# Patient Record
Sex: Male | Born: 1980 | Race: White | Hispanic: No | Marital: Single | Smoking: Current every day smoker
Health system: Southern US, Community
[De-identification: ages and names within clinical notes are randomized; demographics above are authoritative.]

---

## 2010-10-26 ENCOUNTER — Emergency Department (HOSPITAL_BASED_OUTPATIENT_CLINIC_OR_DEPARTMENT_OTHER)
Admission: EM | Admit: 2010-10-26 | Discharge: 2010-10-27 | Disposition: A | Discharge status: Home / Self Care | Payer: Self-pay | Attending: Emergency Medicine | Admitting: Emergency Medicine

## 2010-10-26 ENCOUNTER — Emergency Department (HOSPITAL_BASED_OUTPATIENT_CLINIC_OR_DEPARTMENT_OTHER): Payer: Self-pay

## 2010-10-26 ENCOUNTER — Emergency Department (INDEPENDENT_AMBULATORY_CARE_PROVIDER_SITE_OTHER): Payer: Self-pay

## 2010-10-26 DIAGNOSIS — K859 Acute pancreatitis without necrosis or infection, unspecified: Secondary | ICD-10-CM | POA: Insufficient documentation

## 2010-10-26 DIAGNOSIS — R1013 Epigastric pain: Secondary | ICD-10-CM

## 2010-10-26 LAB — CBC
HCT: 44.2 % (ref 39.0–52.0)
Hemoglobin: 16.2 g/dL (ref 13.0–17.0)
MCV: 86.3 fL (ref 78.0–100.0)
RDW: 12.5 % (ref 11.5–15.5)
WBC: 10.8 10*3/uL — ABNORMAL HIGH (ref 4.0–10.5)

## 2010-10-26 LAB — URINALYSIS, ROUTINE W REFLEX MICROSCOPIC
Hgb urine dipstick: NEGATIVE
Nitrite: NEGATIVE
Protein, ur: NEGATIVE mg/dL
Specific Gravity, Urine: 1.022 (ref 1.005–1.030)
Urobilinogen, UA: 0.2 mg/dL (ref 0.0–1.0)

## 2010-10-26 LAB — DIFFERENTIAL
Basophils Absolute: 0 10*3/uL (ref 0.0–0.1)
Basophils Relative: 0 % (ref 0–1)
Eosinophils Absolute: 0.4 10*3/uL (ref 0.0–0.7)
Neutro Abs: 8 10*3/uL — ABNORMAL HIGH (ref 1.7–7.7)
Neutrophils Relative %: 74 % (ref 43–77)

## 2010-10-26 LAB — LIPASE, BLOOD: Lipase: 790 U/L — ABNORMAL HIGH (ref 11–59)

## 2010-10-26 LAB — COMPREHENSIVE METABOLIC PANEL
ALT: 15 U/L (ref 0–53)
Alkaline Phosphatase: 68 U/L (ref 39–117)
BUN: 11 mg/dL (ref 6–23)
CO2: 27 mEq/L (ref 19–32)
GFR calc non Af Amer: >60 mL/min (ref >60)
Glucose, Bld: 103 mg/dL — ABNORMAL HIGH (ref 70–99)
Potassium: 3.7 mEq/L (ref 3.5–5.1)
Sodium: 139 mEq/L (ref 135–145)
Total Bilirubin: 0.7 mg/dL (ref 0.3–1.2)

## 2010-10-26 MED ORDER — IOHEXOL 300 MG/ML  SOLN
100.0000 mL | Freq: Once | INTRAMUSCULAR | Status: AC | PRN
  Administered 2010-10-26: 100 mL via INTRAVENOUS

## 2010-10-27 ENCOUNTER — Telehealth: Payer: Self-pay | Admitting: Internal Medicine

## 2010-10-27 NOTE — Telephone Encounter (Signed)
Pt was seen at [facility] last night for pancreatitis. Offered pt an appt with Dr. Marina Goodell for May 14th and informed pt that there would be a copay of $184.00. Pt states that he cannot afford the copay and cancelled the appt. Pt had questions about diet with pancreatitis and I reviewed what he should stay away from . Informed pt that [facility] was on unassigned call for [facility] and that he may want to check with them. Pt verbalized understanding.

## 2011-10-23 IMAGING — CR DG CHEST 2V
2 series · 2 of 2 positions shown · non-contrast
Comparison: None.

CLINICAL DATA: Epigastric abdominal pain

CHEST - 2 VIEW

[w chest pa]
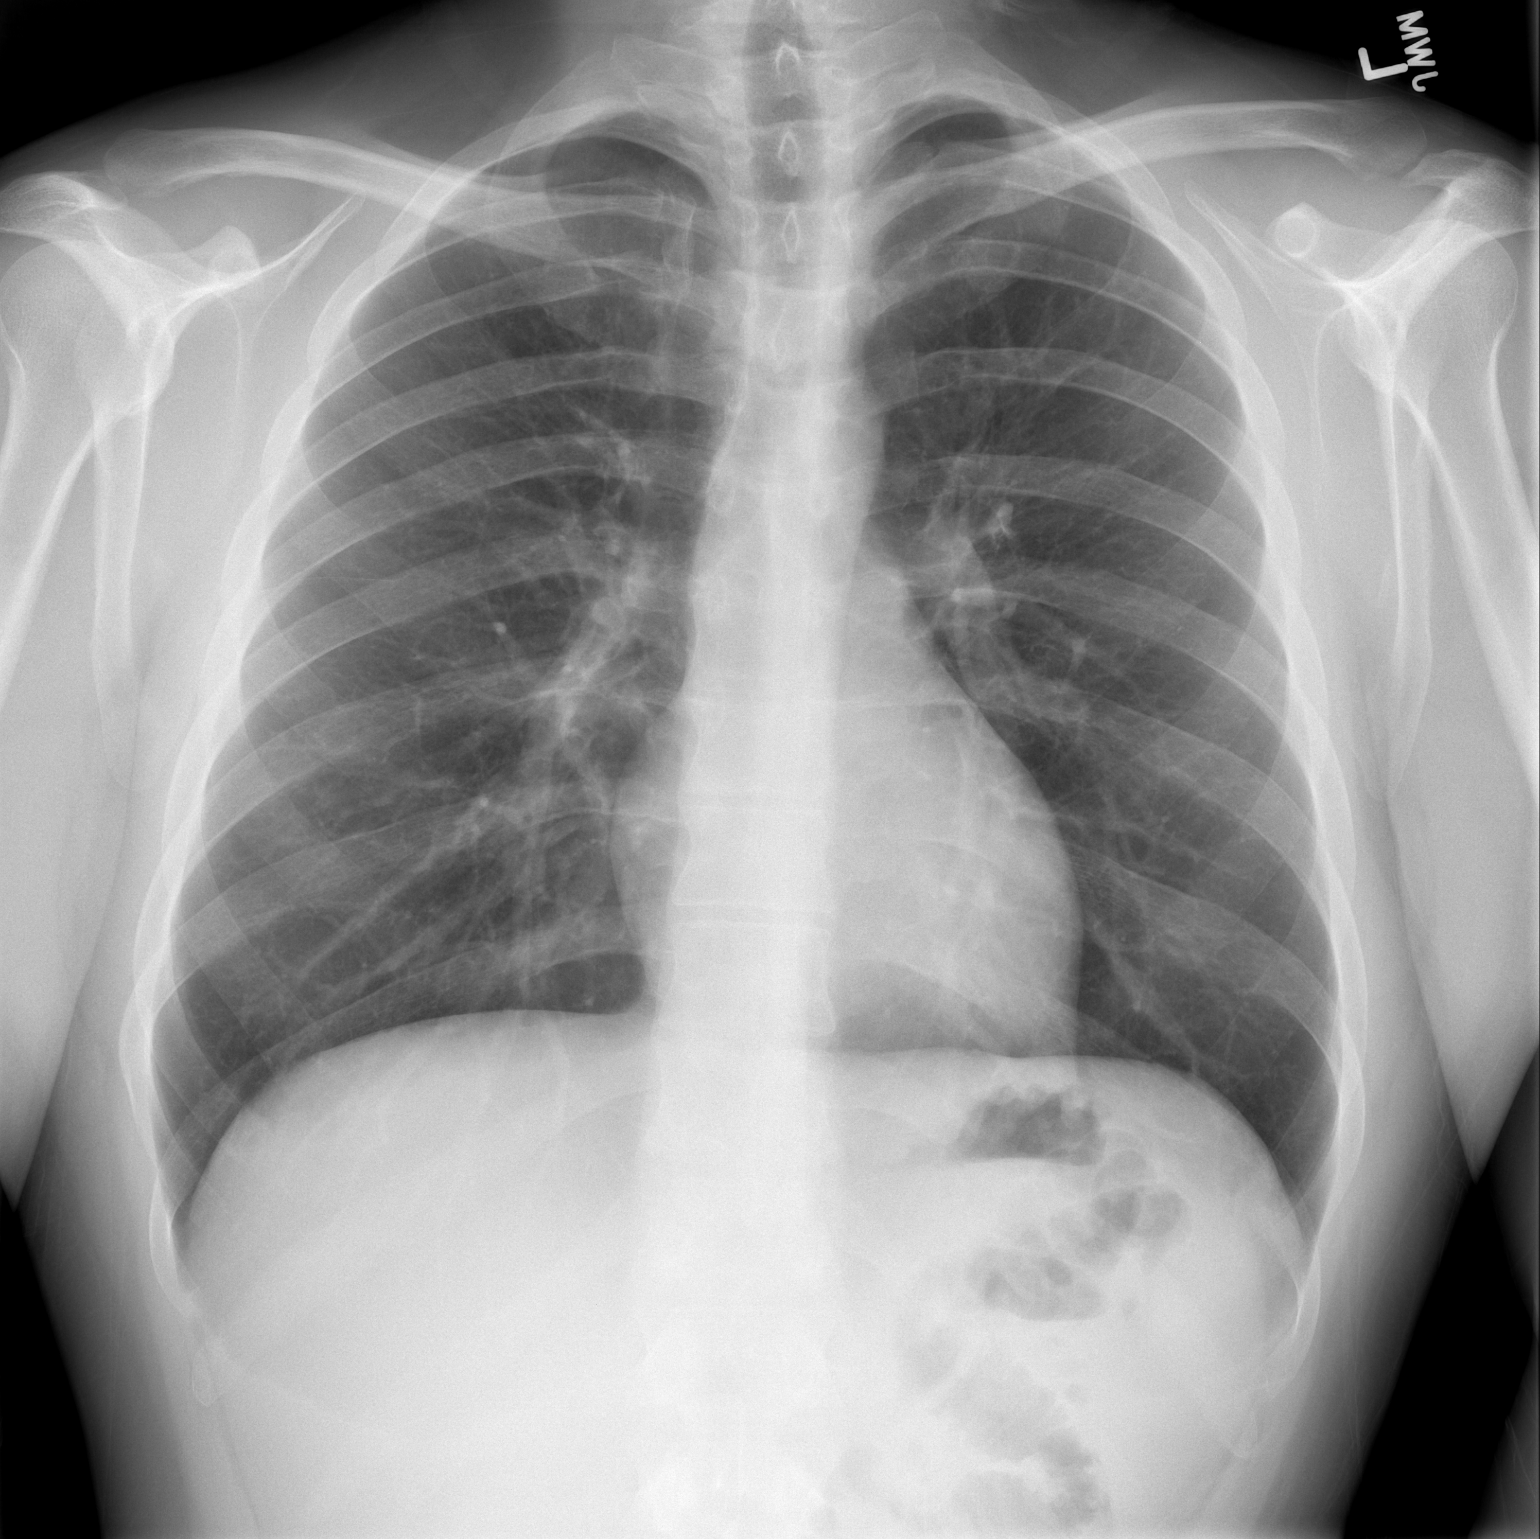

[w chest lat]
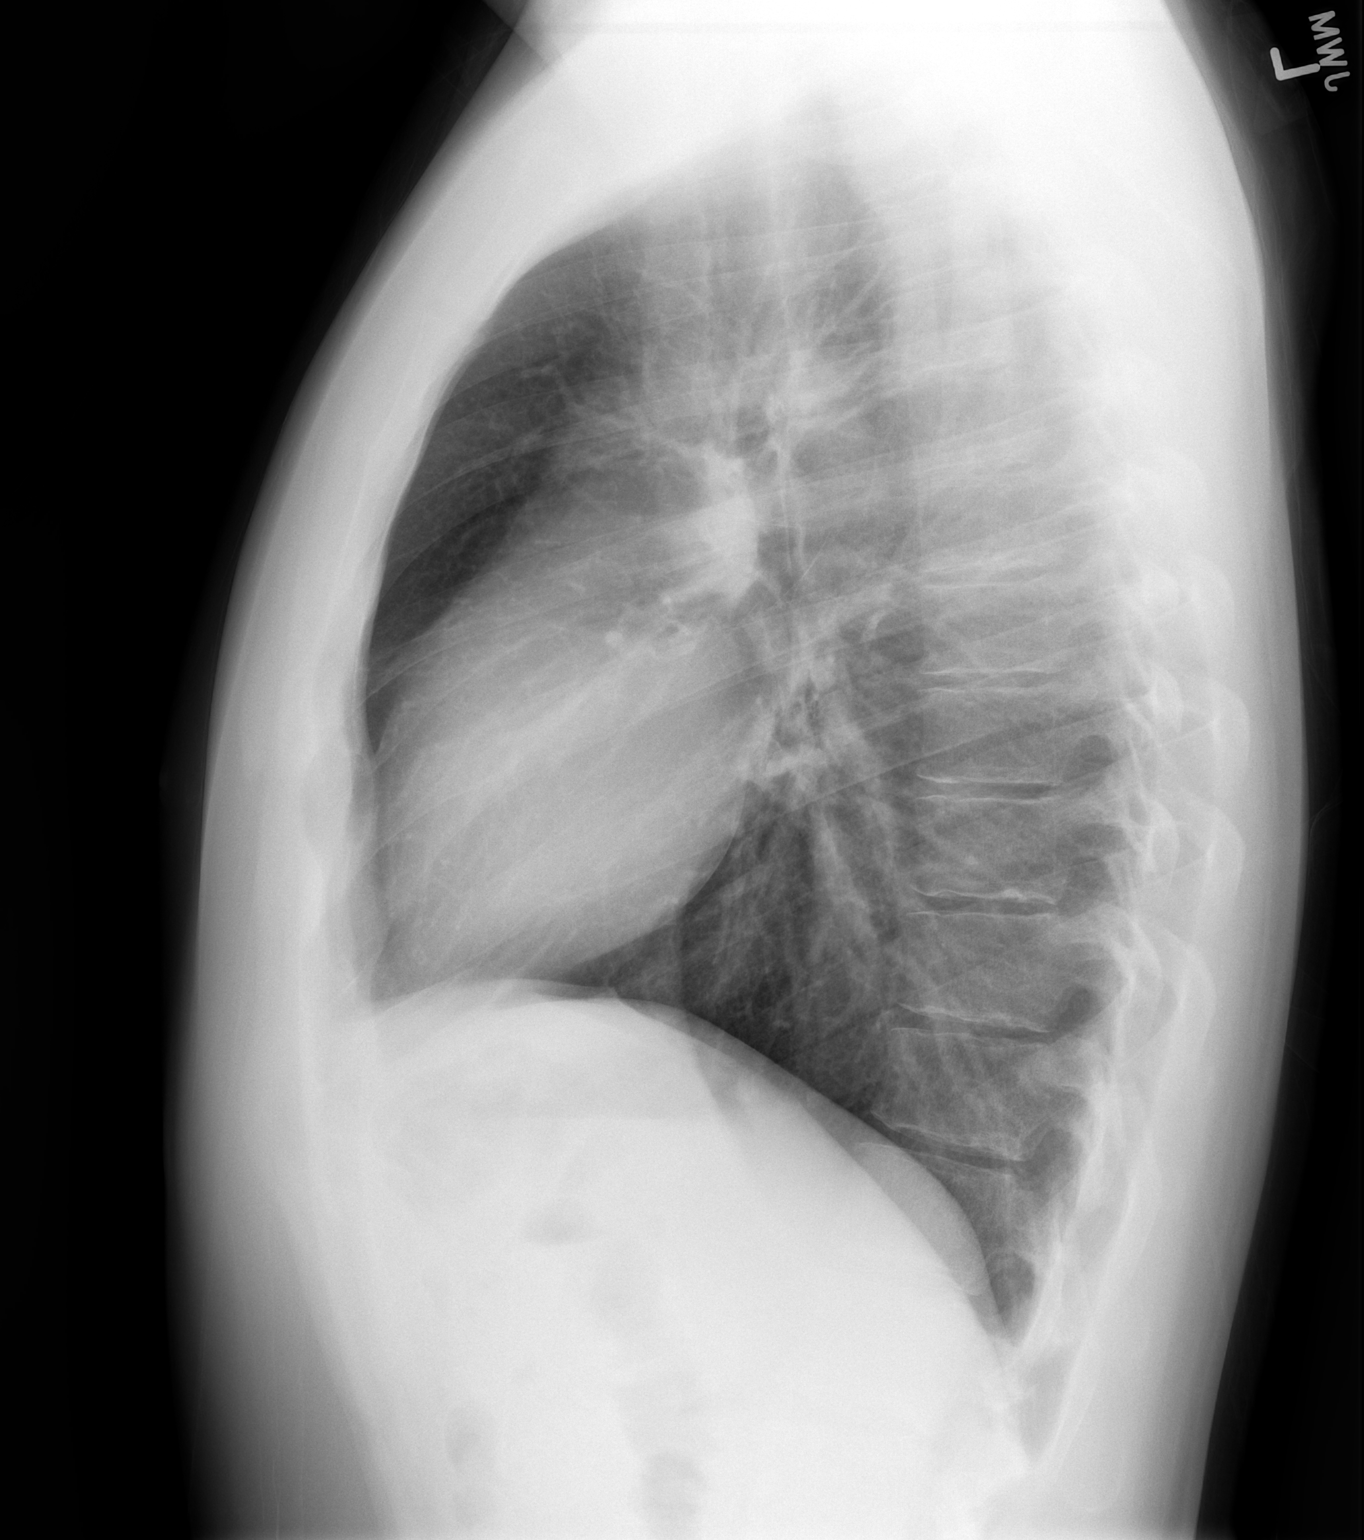

[2 of 2 positions shown; findings below may reference images not displayed]

FINDINGS: The heart size and mediastinal contours are within
normal limits.  Both lungs are clear.  The visualized skeletal
structures are unremarkable.
IMPRESSION: No active cardiopulmonary disease.

## 2011-10-23 IMAGING — CT CT ABD-PELV W/ CM
2 of 4 series · 16 of 46 positions shown, 18 images · IV contrast (APPLIED)
Comparison: None.

CLINICAL DATA: Epigastric abdominal pain

CT ABDOMEN AND PELVIS WITH CONTRAST
TECHNIQUE: Multidetector CT imaging of the abdomen and pelvis was
performed following the standard protocol during bolus
administration of intravenous contrast.
Contrast: 5 ml [id number]

[Series 2: abd/pelvis 5.0 b31f · axial · 0.75mm/px · z∈[-511,-91]mm · 13 of 92 slices shown, 15 images]
[im 4/92  soft-tissue]
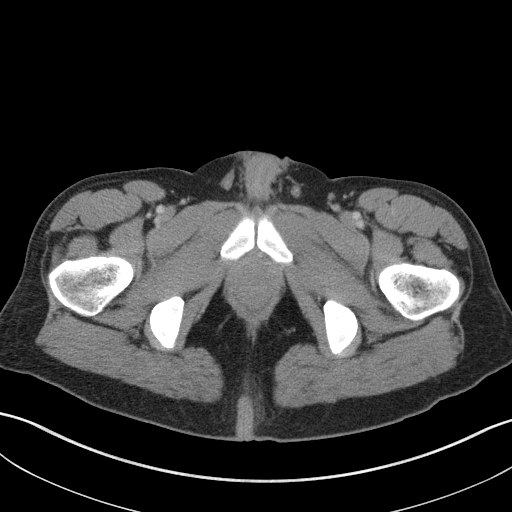
[im 4/92  bone]
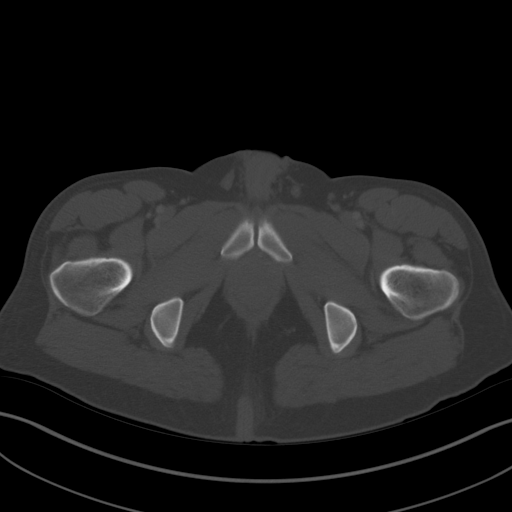
[im 11/92  soft-tissue]
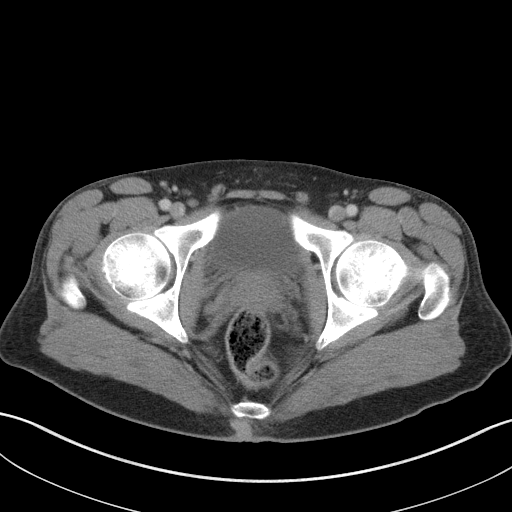
[im 19/92  soft-tissue]
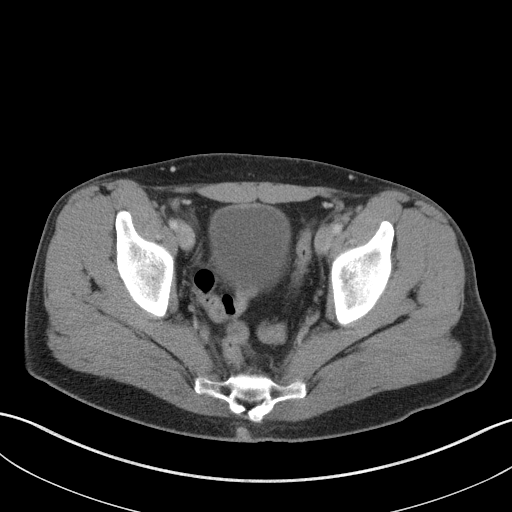
[im 26/92  soft-tissue]
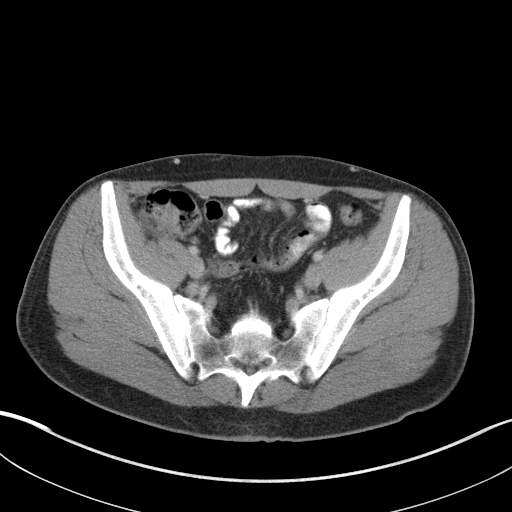
[im 33/92  soft-tissue]
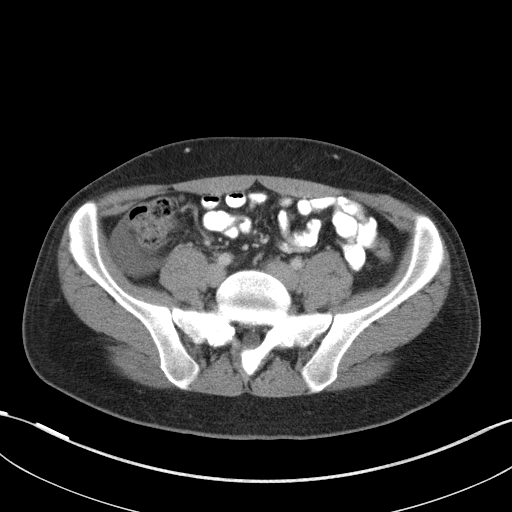
[im 41/92  soft-tissue]
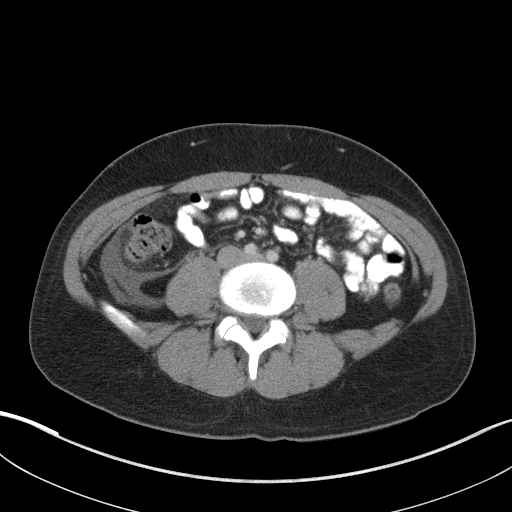
[im 48/92  soft-tissue]
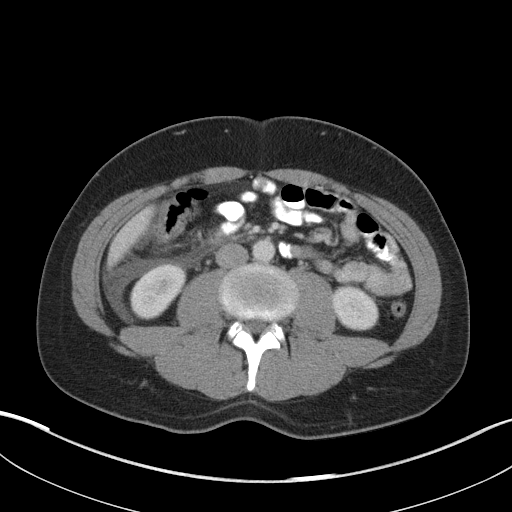
[im 51/92  soft-tissue]
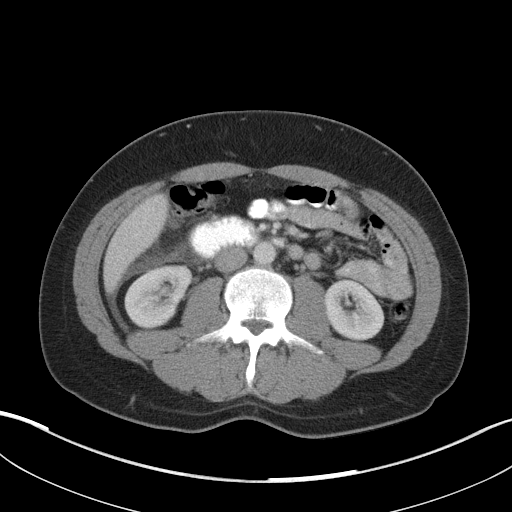
[im 59/92  soft-tissue]
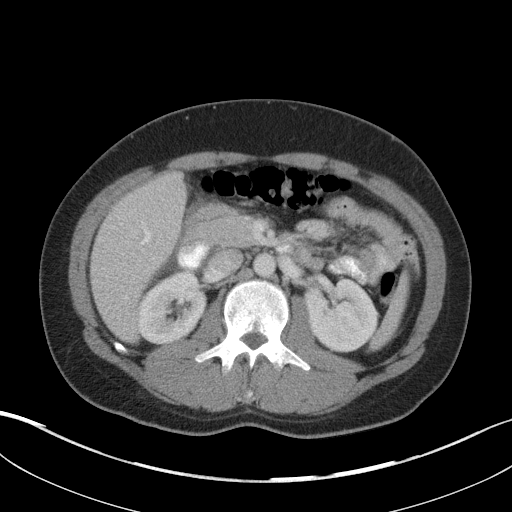
[im 59/92  bone]
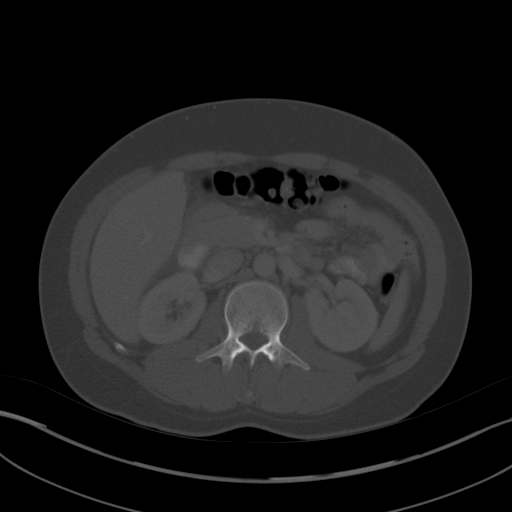
[im 66/92  soft-tissue]
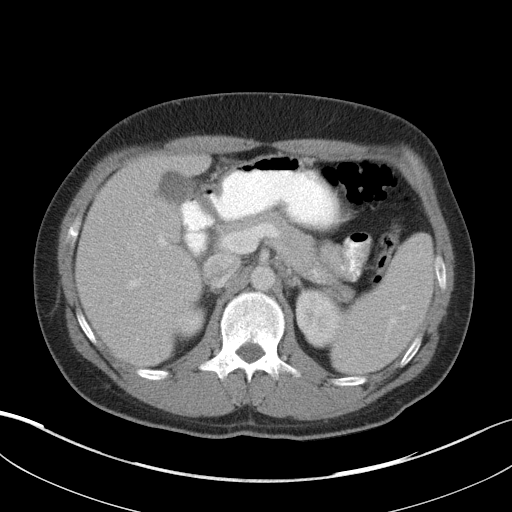
[im 73/92  soft-tissue]
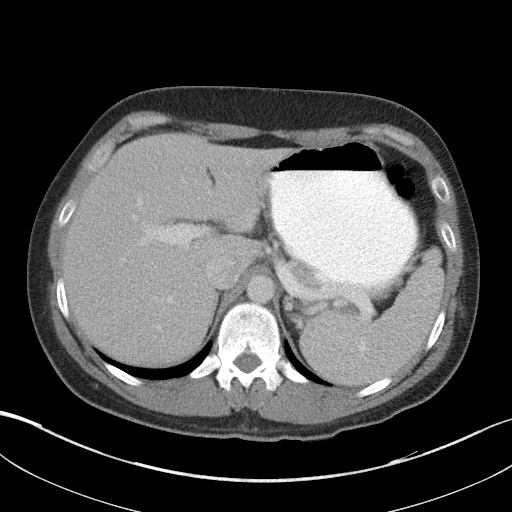
[im 81/92  soft-tissue]
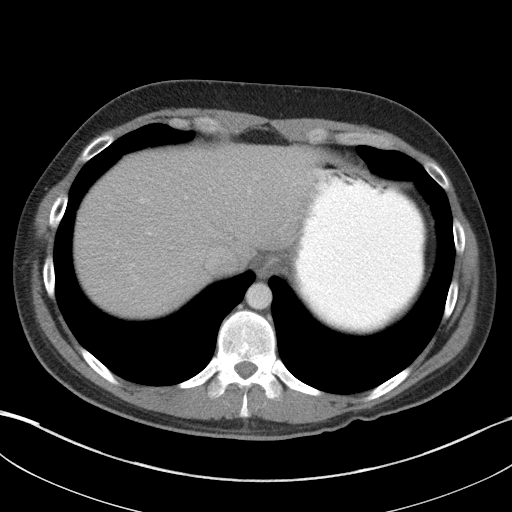
[im 88/92  soft-tissue]
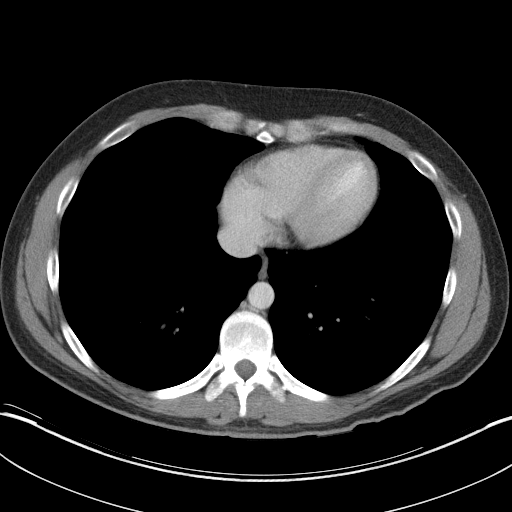

[Series 5: abd/pelvis 3.0 coronal · coronal · 0.79mm/px · 3 of 85 slices shown]
[im 29/85  soft-tissue]
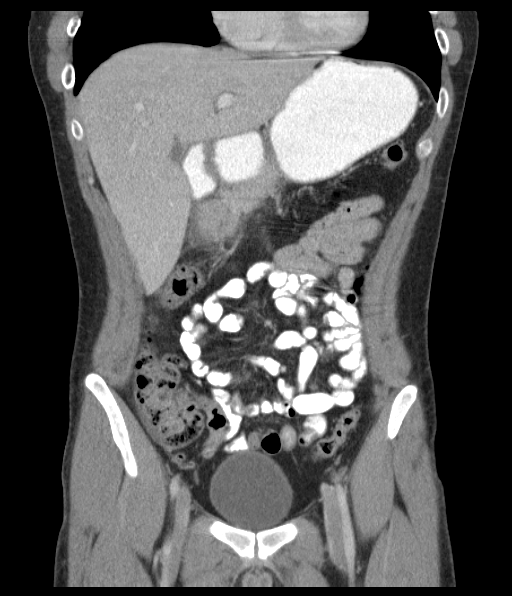
[im 38/85  soft-tissue]
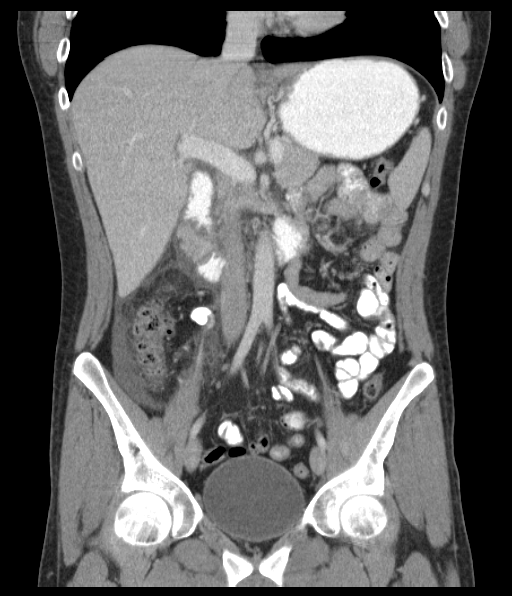
[im 47/85  soft-tissue]
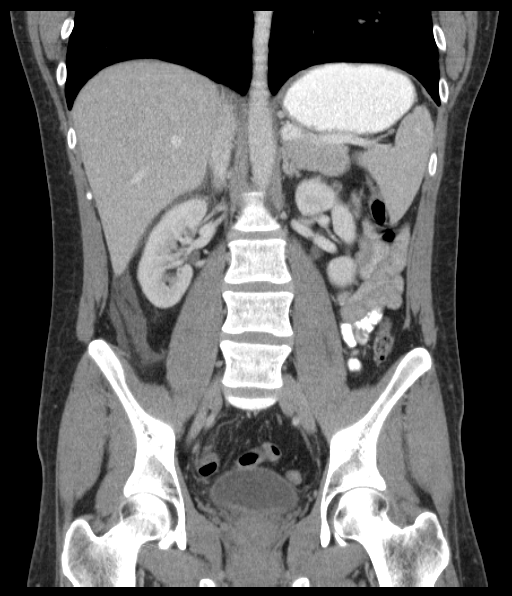

[16 of 46 positions shown; findings below may reference images not displayed]

FINDINGS: Lung bases clear.  Normal heart size.  No pericardial or
pleural effusion.

Abdomen:  Inflammation and fluid noted around the pancreas and
duodenum.  Fluid extends along the right pericolic gutter into the
upper pelvis.  Pancreas has an annular configuration surrounding
the duodenum.  Findings consistent with acute pancreatitis.  No
discrete pseudocyst, abscess, or pancreatic ductal dilatation.
Pancreas tissue continues to enhance normally without necrosis.  No
pancreatic ductal dilatation.

Liver, biliary system, gallbladder, adrenal glands, spleen,
kidneys, adrenal glands within normal limits for age and
demonstrate no acute finding.  Splenic, portal and mesenteric veins
are all patent.

No bowel obstruction, dilatation, ileus, or free air.  Normal
appendix identified.

Pelvis:  Small amount of dependent pelvic fluid.  Distal colon is
decompressed.  No pelvic hemorrhage, hematoma, abscess, abnormal
adenopathy, or inguinal abnormality.

No acute osseous finding.
IMPRESSION: Annular pancreas configuration with superimposed acute
pancreatitis.  Edema and free fluid extend along the right
paracolic gutter.

Negative for pseudocyst, abscess, pancreas necrosis, or vascular
complication.

## 2017-02-14 ENCOUNTER — Encounter (HOSPITAL_COMMUNITY): Payer: Self-pay | Admitting: Emergency Medicine

## 2017-02-14 ENCOUNTER — Emergency Department (HOSPITAL_COMMUNITY)
Admission: EM | Admit: 2017-02-14 | Discharge: 2017-02-14 | Disposition: A | Discharge status: Home / Self Care | Payer: BLUE CROSS/BLUE SHIELD | Attending: Emergency Medicine | Admitting: Emergency Medicine

## 2017-02-14 DIAGNOSIS — Y929 Unspecified place or not applicable: Secondary | ICD-10-CM | POA: Diagnosis not present

## 2017-02-14 DIAGNOSIS — F17210 Nicotine dependence, cigarettes, uncomplicated: Secondary | ICD-10-CM | POA: Insufficient documentation

## 2017-02-14 DIAGNOSIS — Y999 Unspecified external cause status: Secondary | ICD-10-CM | POA: Diagnosis not present

## 2017-02-14 DIAGNOSIS — S098XXA Other specified injuries of head, initial encounter: Secondary | ICD-10-CM | POA: Insufficient documentation

## 2017-02-14 DIAGNOSIS — Y9389 Activity, other specified: Secondary | ICD-10-CM | POA: Diagnosis not present

## 2017-02-14 DIAGNOSIS — S0990XA Unspecified injury of head, initial encounter: Secondary | ICD-10-CM | POA: Diagnosis present

## 2017-02-14 MED ORDER — ACETAMINOPHEN 325 MG PO TABS
650.0000 mg | ORAL_TABLET | Freq: Once | ORAL | Status: AC
  Administered 2017-02-14: 650 mg via ORAL
  Filled 2017-02-14: qty 2

## 2017-02-14 NOTE — ED Notes (Signed)
Patient able to walk without assistance and with steady gait. Patient was also able to tolerate PO fluids.
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
## 2017-02-14 NOTE — ED Triage Notes (Signed)
See trauma notes
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
